# Patient Record
Sex: Female | Born: 1950 | Race: White | Hispanic: No | Marital: Married | Smoking: Never smoker
Health system: Southern US, Community
[De-identification: ages and names within clinical notes are randomized; demographics above are authoritative.]

## PROBLEM LIST (undated history)

## (undated) ENCOUNTER — Ambulatory Visit (INDEPENDENT_AMBULATORY_CARE_PROVIDER_SITE_OTHER): Admission: RE | Payer: Self-pay | Admitting: Neurology

## (undated) ENCOUNTER — Ambulatory Visit (INDEPENDENT_AMBULATORY_CARE_PROVIDER_SITE_OTHER): Admission: RE | Payer: Self-pay

## (undated) DIAGNOSIS — A6920 Lyme disease, unspecified: Secondary | ICD-10-CM

---

## 1999-02-11 ENCOUNTER — Emergency Department: Admit: 1999-02-11 | Payer: Self-pay | Source: Emergency Department | Admitting: Emergency Medicine

## 1999-05-09 ENCOUNTER — Ambulatory Visit: Admission: EM | Admit: 1999-05-09 | Payer: Self-pay | Source: Ambulatory Visit | Admitting: Gastroenterology

## 2004-06-22 ENCOUNTER — Ambulatory Visit
Admission: RE | Admit: 2004-06-22 | Disposition: A | Discharge status: Home / Self Care | Payer: Self-pay | Source: Ambulatory Visit

## 2008-01-03 ENCOUNTER — Emergency Department: Admit: 2008-01-03 | Payer: Self-pay | Source: Emergency Department | Admitting: Emergency Medicine

## 2010-07-24 ENCOUNTER — Ambulatory Visit: Admit: 2010-07-24 | Disposition: A | Discharge status: Home / Self Care | Payer: Self-pay | Source: Ambulatory Visit

## 2010-08-20 ENCOUNTER — Ambulatory Visit
Admission: RE | Admit: 2010-08-20 | Payer: Self-pay | Source: Ambulatory Visit | Attending: Gastroenterology | Admitting: Gastroenterology

## 2010-08-20 ENCOUNTER — Ambulatory Visit: Payer: Self-pay

## 2010-08-22 LAB — LAB USE ONLY - HISTORICAL SURGICAL PATHOLOGY

## 2011-05-28 NOTE — Op Note (Signed)
Introduction: [id number] Document ID: [id number] -- 60 year old female      patient presents for an outpatient Colonoscopy on 08/20/2010.            Indications: Screening.            Consent: The benefits, risks, and alternatives to the procedure were      discussed and informed consent was obtained from the patient.            Preparation: EKG, pulse, pulse oximetry, and blood pressure were monitored      throughout the procedure.            Medications: IV administered.            Rectal Exam: Normal rectal exam.            Procedure: The colonoscope was passed through the anus under direct      visualization and was advanced with ease to the cecum, confirmed by      landmarks. The scope was withdrawn and the mucosa was carefully examined.      The quality of the preparation was excellent. The views were excellent.      The patient's toleration of the procedure was excellent. Retroflexion was      performed in the rectum.    The colon was redundant abdominal pressure      applied.            Estimated Blood Loss: Negligible.            Findings: There was evidence of mild colitis in the colon. The mucosa      especially the area of the ascending transverse descending colon had mild      granular appearance areas of erythema questionable colitis questionable      vasculitis. Biopsies were taken from the whole colon. There was evidence      of mild diverticulosis in the proximal descending colon and proximal      sigmoid. Hemorrhoids were found. The hemorrhoids were not bleeding.            Unplanned Events: There were no unplanned events.            Summary: Mild colitis (558.9) found in the colon. Biopsy taken. Mild      diverticulosis (562.10) found in the proximal descending colon and      proximal sigmoid. Hemorrhoids found (455.6).            Recommendations: Follow-up on the results of the biopsy specimens in 1      week. Start high fiber diet.            Patient Transfer: The patient condition on transfer  to recovery was stable.            Procedure Codes: [45380]Colonoscopy with biopsy      Version 1, electronically signed by Dr. Homero Fellers Daylene Vandenbosch on 08/20/2010 at      16:38.      [email or web address]

## 2013-03-12 ENCOUNTER — Encounter (INDEPENDENT_AMBULATORY_CARE_PROVIDER_SITE_OTHER): Payer: Self-pay

## 2016-07-25 ENCOUNTER — Emergency Department: Payer: Commercial Managed Care - POS

## 2016-07-25 ENCOUNTER — Emergency Department
Admission: EM | Admit: 2016-07-25 | Discharge: 2016-07-25 | Disposition: A | Discharge status: Home / Self Care | Payer: Commercial Managed Care - POS | Attending: Emergency Medical Services | Admitting: Emergency Medical Services

## 2016-07-25 DIAGNOSIS — W2209XA Striking against other stationary object, initial encounter: Secondary | ICD-10-CM | POA: Insufficient documentation

## 2016-07-25 DIAGNOSIS — Y9301 Activity, walking, marching and hiking: Secondary | ICD-10-CM | POA: Insufficient documentation

## 2016-07-25 DIAGNOSIS — S0083XA Contusion of other part of head, initial encounter: Secondary | ICD-10-CM | POA: Insufficient documentation

## 2016-07-25 HISTORY — DX: Lyme disease, unspecified: A69.20

## 2016-07-25 MED ORDER — CYCLOBENZAPRINE HCL 10 MG PO TABS
10.0000 mg | ORAL_TABLET | Freq: Three times a day (TID) | ORAL | 0 refills | Status: AC | PRN
Start: 2016-07-25 — End: 2016-07-28

## 2016-07-25 NOTE — ED Triage Notes (Signed)
Pt c/o head pain and neck tenderness.Pt states walking full stride into a glass office wall. Pt did not fall to the ground or LOC.

## 2016-07-25 NOTE — ED Provider Notes (Signed)
EMERGENCY DEPARTMENT HISTORY AND PHYSICAL EXAM     Physician/Midlevel provider first contact with patient: 07/25/16 1720         Date: 07/25/2016  Patient Name: Melanie Velazquez  Attending Physician: He, Zadie Rhine, MD PhD  Advanced Practice Provider: Arnoldo Morale    History of Presenting Illness       History Provided By: Patient     Chief Complaint:  Chief Complaint   Patient presents with   . Head Injury     Onset: sudden  Timing: 2:34pm  Location: head  Quality: throbbing  Severity: mild  Exacerbating factors: none  Alleviating factors: ibuprofen 30 min after impact  Associated Symptoms: neck pain, left ring finger pain, right knee pain  Pertinent Negatives: anticoagulants, n/v, change in vision, HA, SOB/CP, dizziness, AMS.     Additional History: Melanie Velazquez is a 66 y.o. female not on anticoagulants presenting to the ED with contusion to forehead after running straight into a door at a job interview. Patient did not fall, no LOC, immediately ambulatory. Denies n/v, dizziness, changes in vision.     PCP: Clydell Hakim, MD  SPECIALISTS:    No current facility-administered medications for this encounter.      Current Outpatient Prescriptions   Medication Sig Dispense Refill   . venlafaxine (EFFEXOR) 37.5 MG tablet Take 37.5 mg by mouth 2 (two) times daily.     . cyclobenzaprine (FLEXERIL) 10 MG tablet Take 1 tablet (10 mg total) by mouth 3 (three) times daily as needed for Muscle spasms.for up to 3 days 10 tablet 0       Past History     Past Medical History:  Past Medical History:   Diagnosis Date   . Lyme disease        Past Surgical History:  History reviewed. No pertinent surgical history.    Family History:  History reviewed. No pertinent family history.    Social History:  Social History     Social History   . Marital status: Married     Spouse name: N/A   . Number of children: N/A   . Years of education: N/A     Social History Main Topics   . Smoking status: Never Smoker   . Smokeless tobacco:  Never Used   . Alcohol use Yes   . Drug use: No   . Sexual activity: Not Asked     Other Topics Concern   . None     Social History Narrative   . None       Allergies:  No Known Allergies    Review of Systems     Review of Systems   Constitutional: Negative for chills and fever.   HENT: Negative for congestion, ear pain, hearing loss, sinus pain and sore throat.    Eyes: Negative for blurred vision, double vision, photophobia and pain.   Respiratory: Negative for cough, sputum production, shortness of breath and wheezing.    Cardiovascular: Negative for chest pain, palpitations and leg swelling.   Gastrointestinal: Negative for abdominal pain, diarrhea, nausea and vomiting.   Musculoskeletal: Positive for neck pain. Negative for back pain and joint pain.   Skin: Negative for itching and rash.   Neurological: Negative for dizziness, sensory change, speech change, loss of consciousness and headaches.       Physical Exam     Vitals:    07/25/16 1701   BP: 129/77   Pulse: 82   Resp: 15  Temp: 97.6 F (36.4 C)   TempSrc: Oral   SpO2: 98%   Weight: 55.8 kg   Height: 5' 6.5" (1.689 m)       Physical Exam   Constitutional: She is oriented to person, place, and time. She appears well-developed and well-nourished. No distress.   Patient sitting on bed in NAD with contusion to forehead.   HENT:   Head: Head is with contusion.       Right Ear: Tympanic membrane and external ear normal. No decreased hearing is noted.   Left Ear: Tympanic membrane and external ear normal. No decreased hearing is noted.   Nose: Nose normal. No nasal septal hematoma. Right sinus exhibits no maxillary sinus tenderness and no frontal sinus tenderness. Left sinus exhibits no maxillary sinus tenderness and no frontal sinus tenderness.   Mouth/Throat: Uvula is midline, oropharynx is clear and moist and mucous membranes are normal. No uvula swelling.   Eyes: EOM are normal. Pupils are equal, round, and reactive to light.   Neck: Normal range of motion.  Neck supple.   Cardiovascular: Normal rate, regular rhythm, normal heart sounds and intact distal pulses.    Pulmonary/Chest: Effort normal and breath sounds normal.   Abdominal: Soft. Bowel sounds are normal. She exhibits no distension and no mass. There is no tenderness. There is no guarding.   Musculoskeletal: Normal range of motion. She exhibits no tenderness or deformity.   Lymphadenopathy:     She has no cervical adenopathy.   Neurological: She is alert and oriented to person, place, and time. No cranial nerve deficit or sensory deficit. Coordination normal.   Skin: Skin is warm and dry. Capillary refill takes less than 2 seconds.   Nursing note and vitals reviewed.      Diagnostic Study Results     Labs -     Results     ** No results found for the last 24 hours. **          Radiologic Studies -   Radiology Results (24 Hour)     Procedure Component Value Units Date/Time    CT Cervical Spine without Contrast [[id number]] Collected:  07/25/16 1835    Order Status:  Completed Updated:  07/25/16 1841    Narrative:       CT CERVICAL SPINE    CLINICAL INFORMATION: C-SPINE TRAUMA, NEXUS/CCR NEGATIVE, LOW RISK    COMPARISON: No prior studies are available for comparison.    TECHNIQUE: Multiple thin section axial images were obtained from the  skull base to the thoracic inlet without the administration of  intravenous contrast. Coronal and sagittal reformatted images were then  obtained.    Note that CT scanning at this site utilizes multiple dose reduction  techniques including automatic exposure control, adjustment of the MAS  and/or KVP according to patient size, and use of iterative  reconstruction technique.    FINDINGS: No displaced fracture is detected. No increased atlantoaxial  distance is appreciated. There is no malalignment of the lateral masses.  There is appropriate alignment of the cervical vertebral bodies and  adequate height is maintained. The paravertebral soft tissues are  unremarkable. There is loss  of normal cervical lordosis. There is  multilevel small anterior and posterior osteophytes as well as uncal  hypertrophy.. There is moderate disc space loss at multiple levels,  C3-C4, C4-C5, C5-C6 and C6-C7. There is bilateral neural foramina  narrowing at C3-C4, C4-C5 and on the left at C5-C6.    Lung apices are clear.  There are small bilateral thyroid nodules.      Impression:          1 No evidence of fracture. Training of the spine may be secondary to  muscle spasm.  2. Bilateral small thyroid nodules.    [place]  Teferra, MD   07/25/2016 6:37 PM    CT Head without Contrast [[id number]] Collected:  07/25/16 1832    Order Status:  Completed Updated:  07/25/16 1839    Narrative:       CT Head without contrast         Indication:    Femoral head contusion, head trauma.    Comparison:  None available    Technique:  Axial CT images were obtained through the brain from the  skull base to the vertex without administration of IV contrast.  A  combination of automatic exposure control, adjustment of the mA and/or  kV  according to patient size and/or use of iterative reconstruction  technique was   utilized.    Findings:     There is no intracranial hemorrhage or extra-axial fluid collection. No  acute territorial ischemia, midline shift or mass effect. Ventricles and  sulci are within normal limits for patient's age. Gray-white matter  differentiation is maintained.       The included paranasal sinuses and mastoid air cells are clear except  for mild left maxillary sinus disease..    There is a small left frontal scalp hematoma. No displaced skull  fracture.      Impression:          1. No acute intracranial hemorrhage, midline shift or mass effect.  2. Left frontal scalp hematoma. No underlying fracture.    [place]  Teferra, MD   07/25/2016 6:35 PM      .    Medical Decision Making   I am the first provider for this patient.    I reviewed the vital signs, available nursing notes, past medical history, past surgical  history, family history and social history.    Vital Signs-Reviewed the patient's vital signs.     Patient Vitals for the past 12 hrs:   BP Temp Pulse Resp   07/25/16 1701 129/77 97.6 F (36.4 C) 82 15       Pulse Oximetry Analysis - Normal 98% on RA      Old Medical Records: Nursing notes.     ED Course:       Provider Notes:   Head injury, no anticoagulants, neurologically intact. No dizziness, changes in vision, n/v. Head/neck CT negative for fractures/bleeds. D/c with flexeril prescription for muscle spasms and ibuprofen/tyelnol for pain. Discussed heat therapy for neck muscles and ice for forehead contusion.     D/w He, Zadie Rhine, MD PhD      Diagnosis     Clinical Impression:   1. Contusion of forehead, initial encounter        Treatment Plan:   ED Disposition     ED Disposition Condition Date/Time Comment    Discharge  Thu Jul 25, 2016  6:50 PM Melanie Velazquez discharge to home/self care.    Condition at disposition: Stable            _______________________________    CHART OWNERSHIP: I, Terrion Gencarelli Buck Mam, PA-C, am the primary clinician of record.  _______________________________     Arnoldo Morale, PA  07/25/16 2115

## 2016-07-25 NOTE — Discharge Instructions (Signed)
Facial Contusion    You have been diagnosed with a facial contusion.    Contusion is the medical term for a bruise. A facial contusion can be caused by a fall or by being struck in the face.    The skin, muscles and other soft tissues of the face may become swollen and painful. You may have other injuries, like cuts or scrapes. The bones under your face might be bruised.    The doctor does not believe you have injured essential organs, like your eyes, brain or spine.    Apply ice to the face to help with pain and swelling. Place some ice cubes in a re-sealable plastic bag (like Ziploc). Add some water. Seal the bag. Put a thin washcloth between the bag and the skin. Apply the ice bag for at least 20 minutes. Do this at least 4 times per day. It's okay to apply ice longer or more often. NEVER APPLY ICE DIRECTLY TO THE SKIN. Always keep a washcloth between the ice pack and your body. Swelling may increase overnight when your head is down and gravity causes fluids to pool in your face. This should improve within a few hours after you are awake with your head up. Try sleeping with extra pillows to keep your head high.    Use acetaminophen (Tylenol) or ibuprofen (Advil or Motrin) to decrease pain and inflammation. The physician will decide if you need a prescription medication.    If your nose bleeds, pinch it closed for 15 minutes. If that does not stop the bleeding, then return here or to the closest Emergency Department.    If you have a cut that requires stitches, then you will receive additional wound care instructions.    One concern after a facial injury is the possibility of other injuries to the head or neck. The doctor has determined that you do not have any other serious injuries and that it is OK for you to go home. If you develop symptoms of a head or neck injury, return immediately to the nearest Emergency Department.    YOU SHOULD SEEK MEDICAL ATTENTION IMMEDIATELY, EITHER HERE OR AT THE  NEAREST EMERGENCY DEPARTMENT, IF ANY OF THE FOLLOWING OCCURS:   Your headaches are severe or become worse.   You vomit repeatedly.   You are lethargic or difficult to awaken or you feel confused or seem intoxicated (drunk).    You have trouble with coordination or balance, feel dizzy, pass out, or have difficulty speaking or slurred speech.    Your vision changes or your pupils are unequal in size.

## 2022-11-17 IMAGING — MR MRI RIGHT KNEE WITHOUT CONTRAST
4 of 5 series · 26 of 40 positions shown · IV contrast (gadolinium)
Comparison: None

________________________________________________________________________________________________ 
MRI RIGHT KNEE WITHOUT CONTRAST, 11/17/2022 [DATE]: 
CLINICAL INDICATION: Other tear of medial meniscus, current injury, right knee, 
initial encounter. Knee pain after twisting injury playing pickleball.
TECHNIQUE: Multiplanar, multiecho position MR images of the knee were performed 
without intravenous gadolinium enhancement. Patient was scanned on a
magnet.

[Series 301: PD fat-sat · axial · right · 3.0mm · 0.36mm/px · z∈[-57,+66]mm · 8 of 36 slices shown (1 of 3)]
[im 1/36]
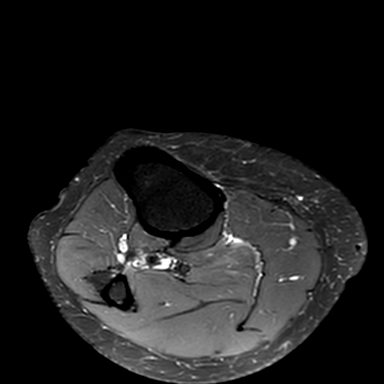
[im 4/36]
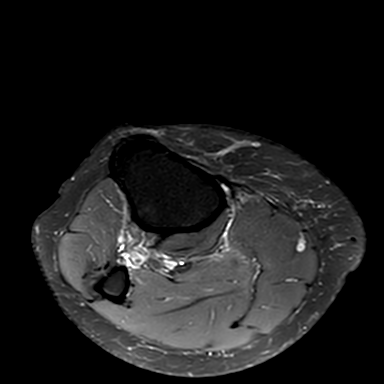
[im 12/36]
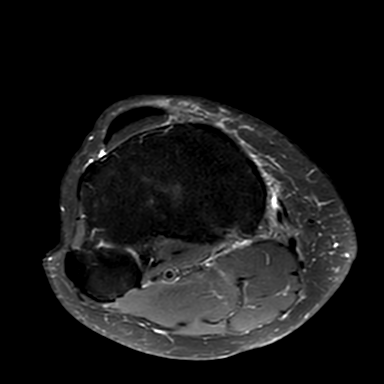
[im 16/36]
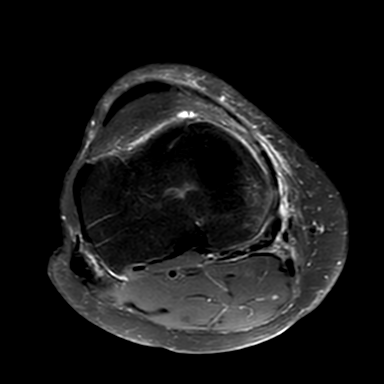
[im 20/36]
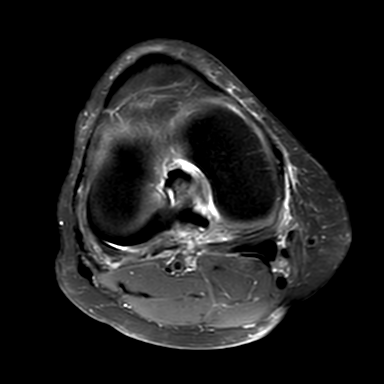
[im 24/36]
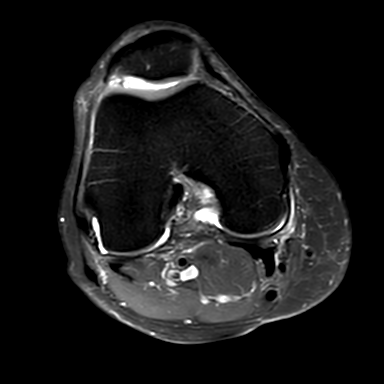
[im 32/36]
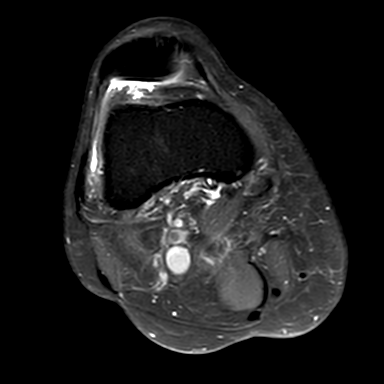
[im 36/36]
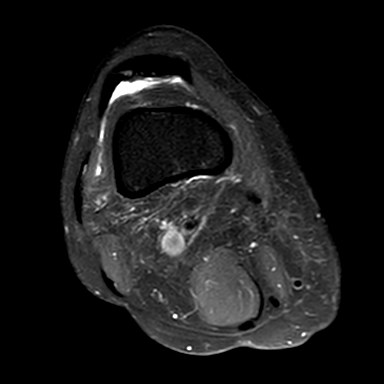

[Series 501: T1 · coronal · right · 3.0mm · 0.31mm/px · 3 of 32 slices shown]
[im 4/32]
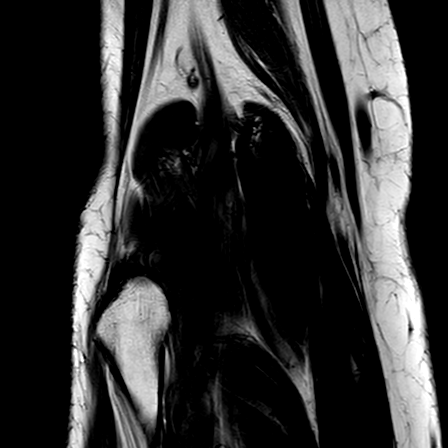
[im 16/32]
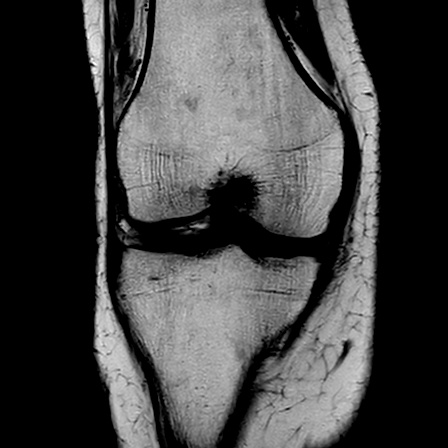
[im 28/32]
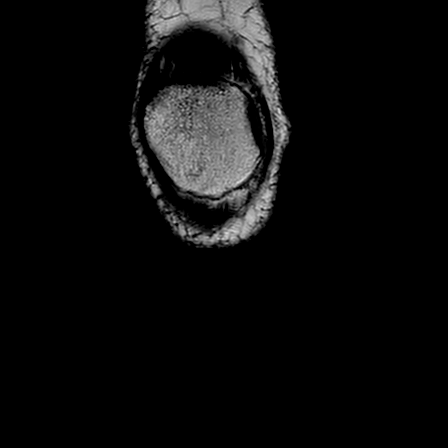

[Series 601: PD fat-sat · coronal · right · 3.0mm · 0.31mm/px · 9 of 32 slices shown (2 of 3)]
[im 1/32]
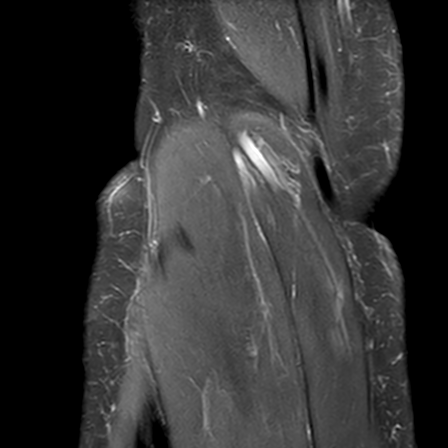
[im 4/32]
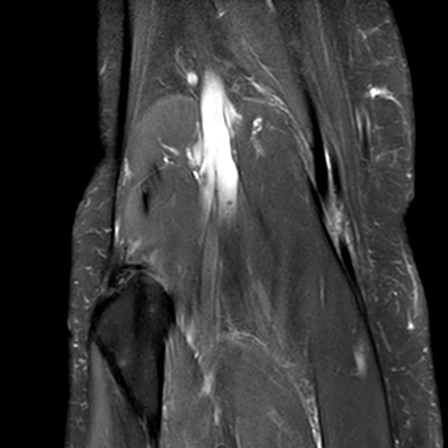
[im 8/32]
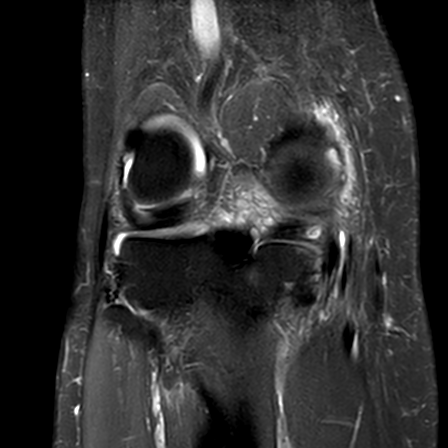
[im 12/32]
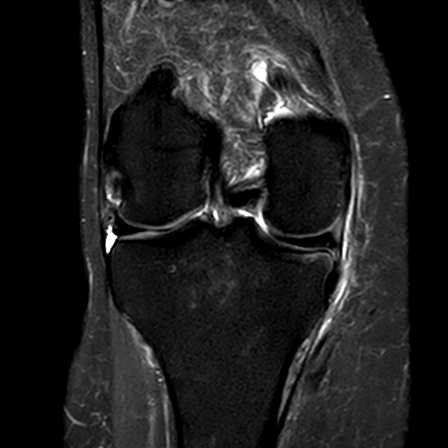
[im 16/32]
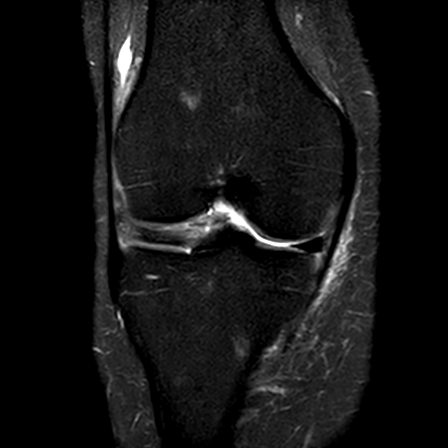
[im 20/32]
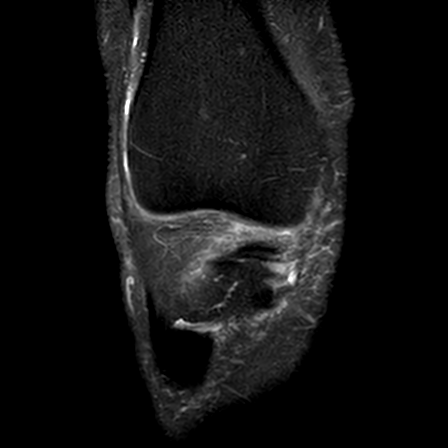
[im 24/32]
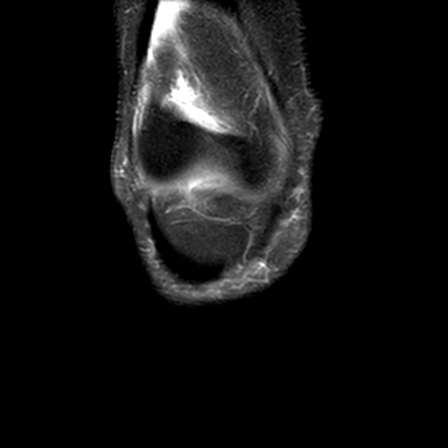
[im 28/32]
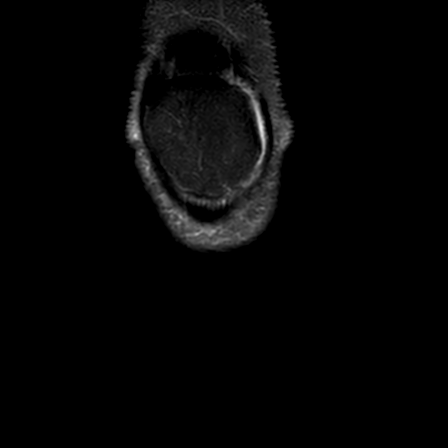
[im 32/32]
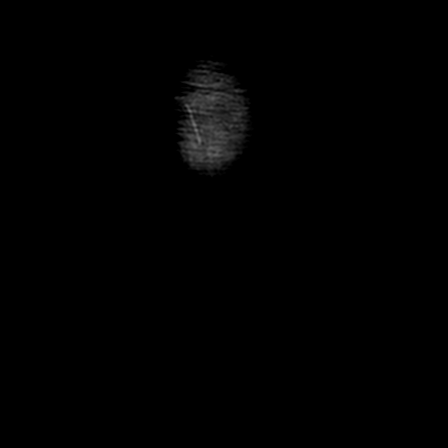

[Series 701: PD fat-sat · sagittal · right · 3.0mm · 0.29mm/px · 6 of 29 slices shown (3 of 3)]
[im 1/29]
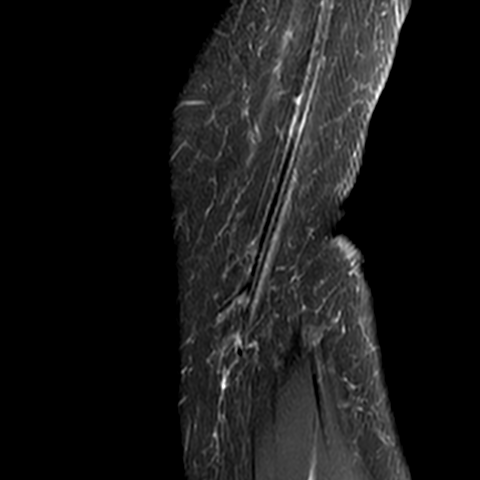
[im 5/29]
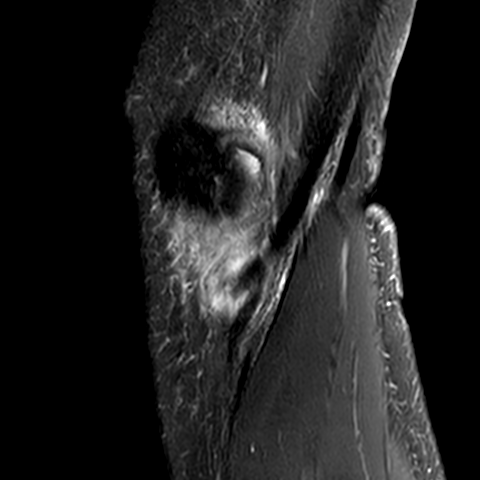
[im 9/29]
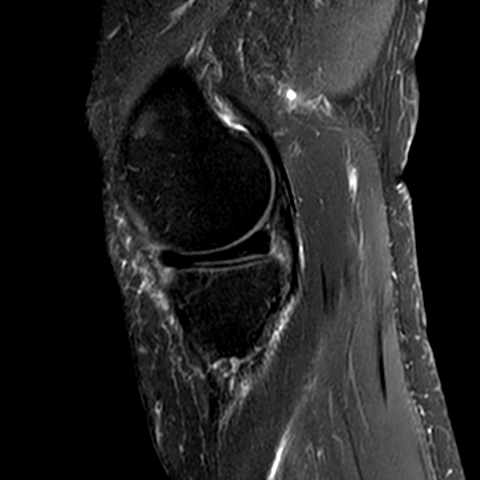
[im 13/29]
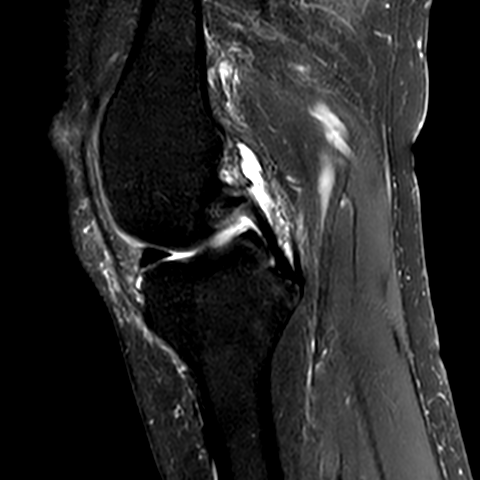
[im 17/29]
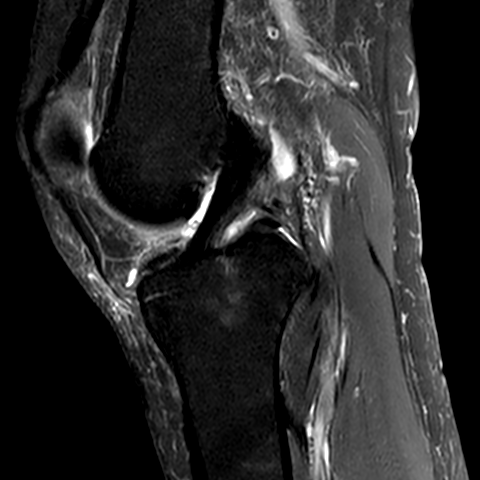
[im 25/29]
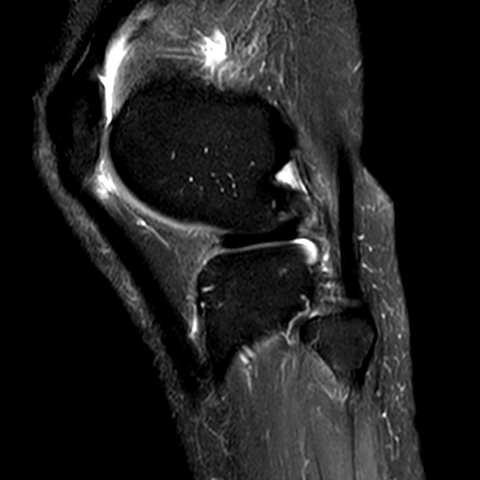

[26 of 40 positions shown; findings below may reference images not displayed]

FINDINGS: MEDIAL COMPARTMENT: Complex tear of the posterior horn of the medial meniscus. 
No medial meniscal extrusion. Articular cartilage is preserved. 
LATERAL COMPARTMENT: The lateral meniscus is intact without tear or extrusion. 
Articular cartilage is preserved. 
PATELLOFEMORAL COMPARTMENT: The patella is centrally located. Up to grade II 
chondromalacia patella. No focal trochlear cartilaginous defects.  
TIBIOFIBULAR COMPARTMENT: Negative. 
LIGAMENTS: The anterior cruciate ligament is intact. The posterior cruciate 
ligament is intact. Mild soft tissue swelling about the intact medial collateral 
ligament (MCL), consistent with grade 1 sprain. The lateral collateral ligament 
is preserved. 
EXTENSOR MECHANISM: The quadriceps and patellar tendon are preserved. The medial 
and lateral retinacula are intact. 
POSTEROMEDIAL CORNER: The semimembranosus and pes anserine tendons are 
preserved. The posterior oblique ligament and posterior medial joint capsule are 
intact. 
POSTEROLATERAL CORNER: The popliteal tendon and popliteofibular ligament are 
intact. The biceps femoris is negative. 
BONES: No fracture. Mild medial and central tibial subchondral bone marrow 
edema. 
ADDITIONAL FINDINGS: Small joint effusion. No popliteal cyst. Mild soft tissue 
swelling in the superolateral Mirizzi fat pad. The musculature is normal without 
mass, signal abnormality or atrophy. Neurovascular bundles are negative. Mild 
deep and subcutaneous tissue soft tissue swelling.
IMPRESSION: 1.  Complex tear of the posterior horn of the medial meniscus.  
2.  Grade 1 MCL sprain. 
3.  Mild medial/central tibial subchondral bone marrow edema may be due to 
stress reaction. 
4.  Small joint effusion and mild soft tissue swelling.
